# Patient Record
Sex: Female | Born: 1970 | Race: White | Hispanic: No | Marital: Single | Smoking: Never smoker
Health system: Southern US, Community
[De-identification: ages and names within clinical notes are randomized; demographics above are authoritative.]

## PROBLEM LIST (undated history)

## (undated) DIAGNOSIS — F909 Attention-deficit hyperactivity disorder, unspecified type: Secondary | ICD-10-CM

## (undated) HISTORY — PX: GASTRIC BYPASS: SHX52

---

## 2009-06-21 ENCOUNTER — Ambulatory Visit: Payer: Self-pay | Admitting: Internal Medicine

## 2009-09-12 ENCOUNTER — Ambulatory Visit: Payer: Self-pay | Admitting: Family Medicine

## 2010-07-08 ENCOUNTER — Ambulatory Visit: Payer: Self-pay | Admitting: Internal Medicine

## 2011-10-24 ENCOUNTER — Ambulatory Visit: Payer: Self-pay

## 2011-12-14 ENCOUNTER — Ambulatory Visit: Payer: Self-pay | Admitting: Family Medicine

## 2011-12-14 LAB — HCG, QUANTITATIVE, PREGNANCY: Beta Hcg, Quant.: <1 m[IU]/mL — ABNORMAL LOW

## 2013-03-04 ENCOUNTER — Ambulatory Visit: Payer: Self-pay

## 2013-05-17 IMAGING — CT CT ABD-PELV W/ CM
1 of 3 series · 13 of 32 positions shown, 18 images · non-contrast
Comparison: none

REASON FOR EXAM: CR [id number] RUQ Elevated White Blood Cell Count
Pelvic Pain
COMMENTS:

PROCEDURE:     CT  - CT ABDOMEN / PELVIS  W  - December 14, 2011  [DATE]
RESULT:     CT abdomen and pelvis dated 12/14/2011
TECHNIQUE: Helical 3 mm sections were obtained from lung bases through the
pubic symphysis status post intravenous ministration of 85 mL of Fsovue-GSS
and oral contrast.

[Series 2: 3mm soft tissue · axial · 0.68mm/px · z∈[-494,-86]mm · 13 of 156 slices shown, 18 images]
[im 10/156  soft-tissue]
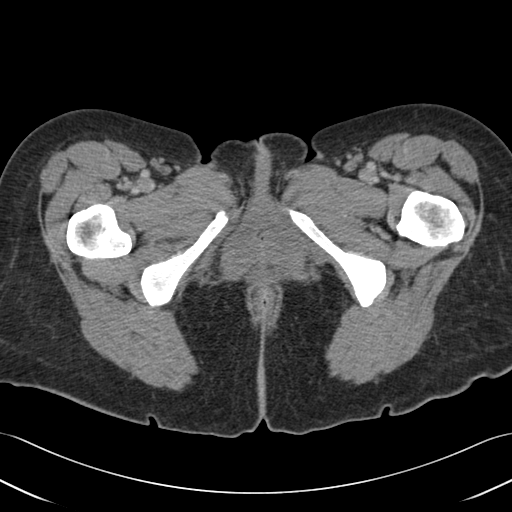
[im 10/156  bone]
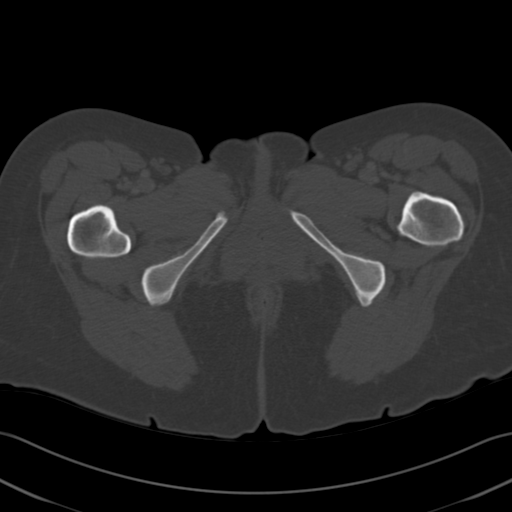
[im 20/156  soft-tissue]
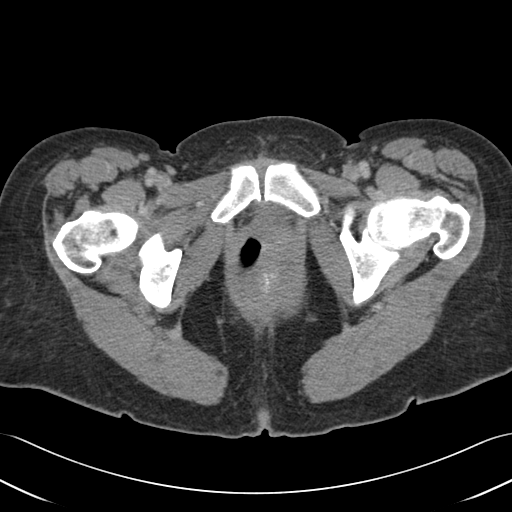
[im 39/156  soft-tissue]
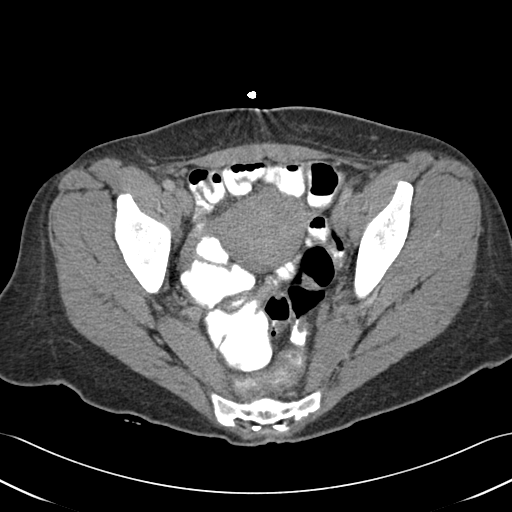
[im 49/156  soft-tissue]
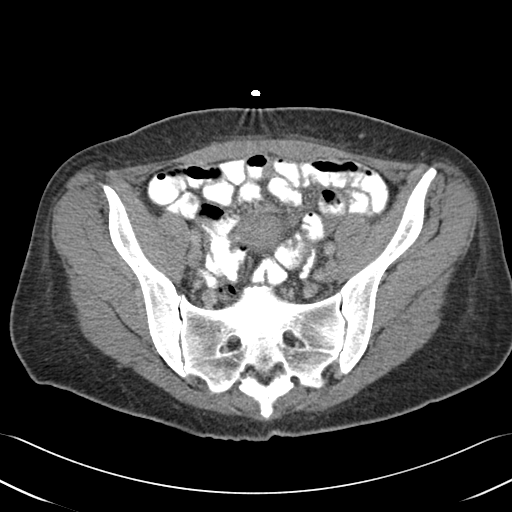
[im 59/156  soft-tissue]
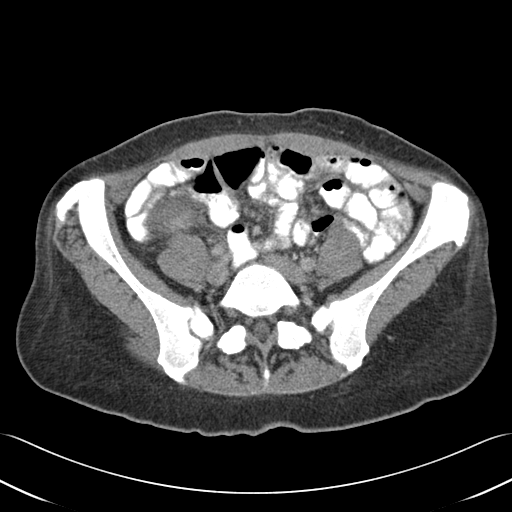
[im 68/156  soft-tissue]
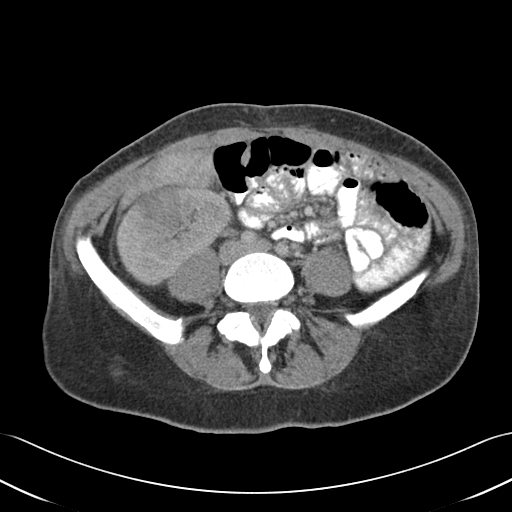
[im 88/156  soft-tissue]
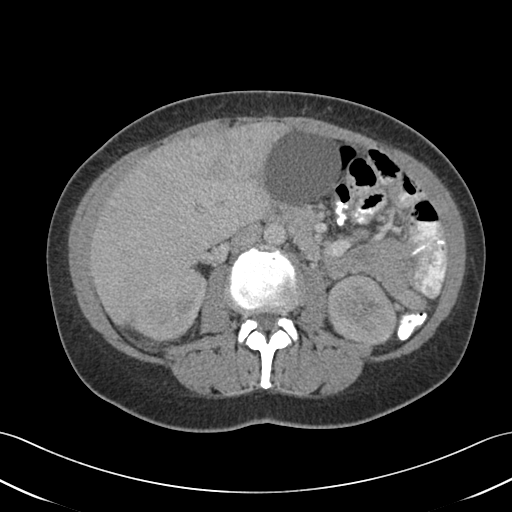
[im 97/156  soft-tissue]
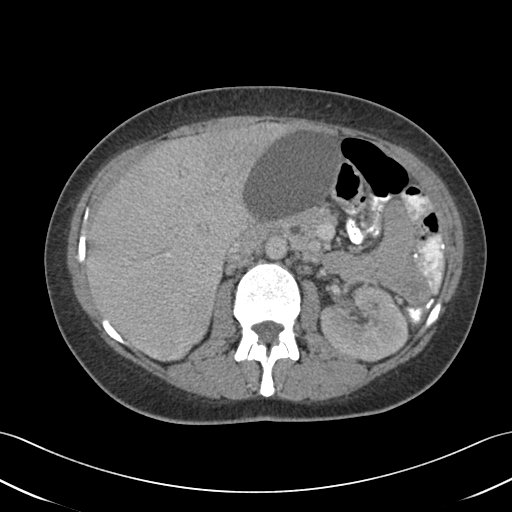
[im 107/156  soft-tissue]
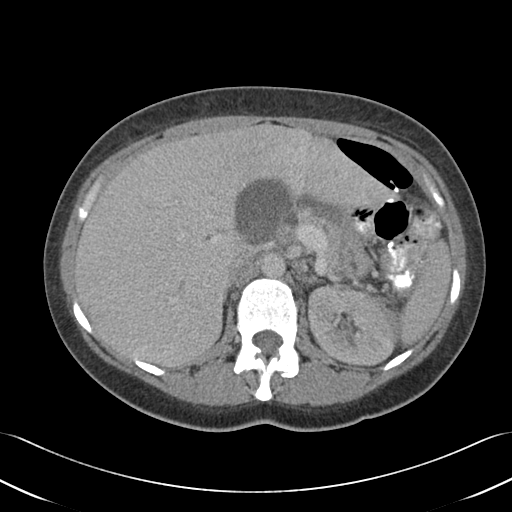
[im 107/156  bone]
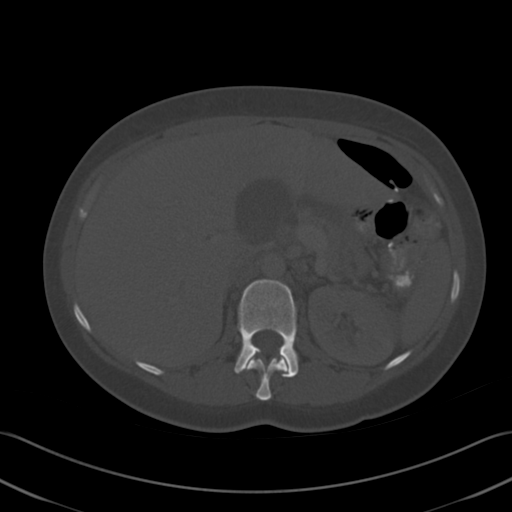
[im 117/156  soft-tissue]
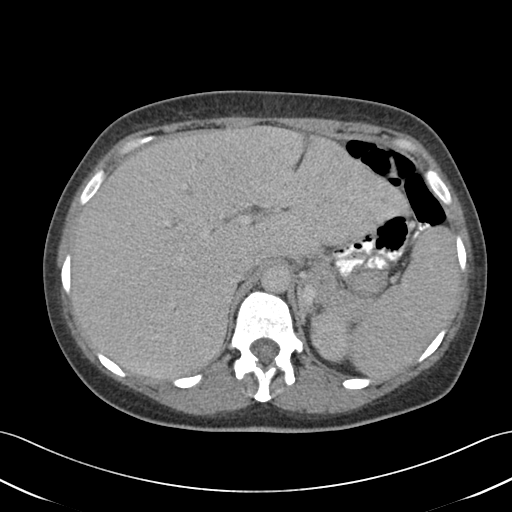
[im 117/156  lung]
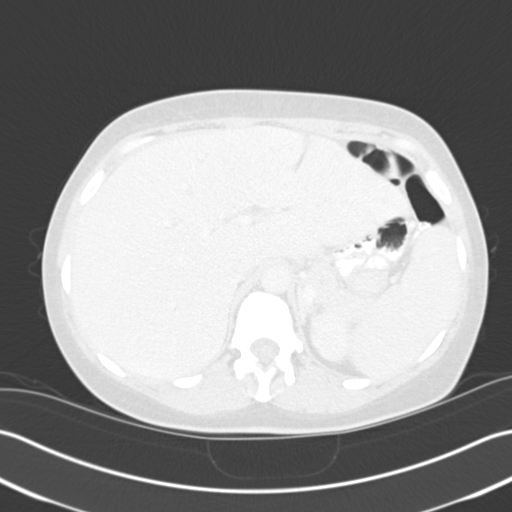
[im 126/156  lung]
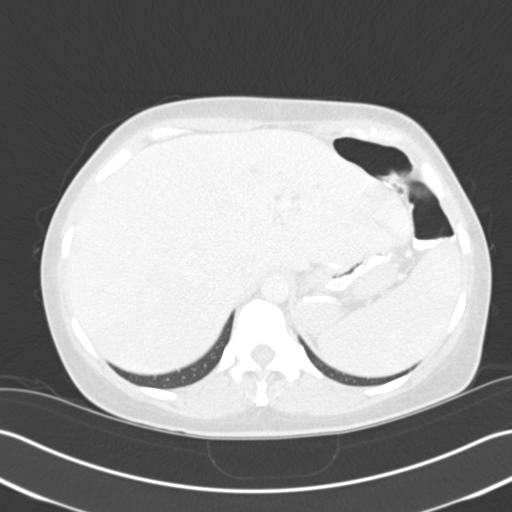
[im 136/156  soft-tissue]
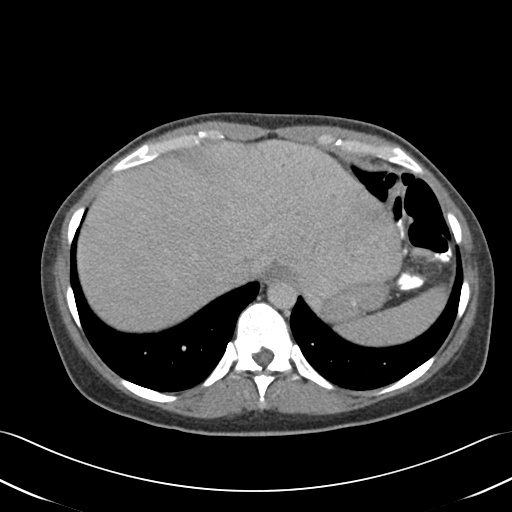
[im 136/156  lung]
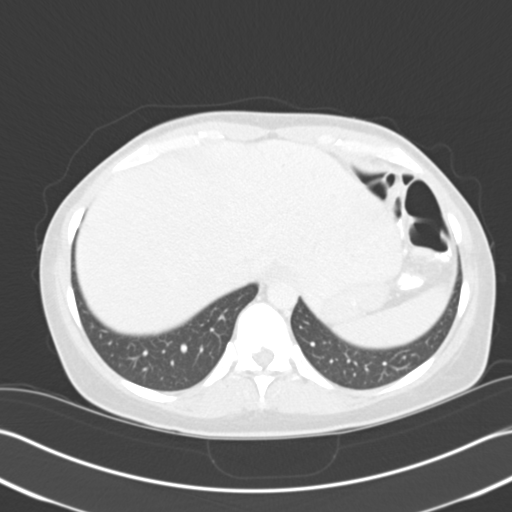
[im 146/156  soft-tissue]
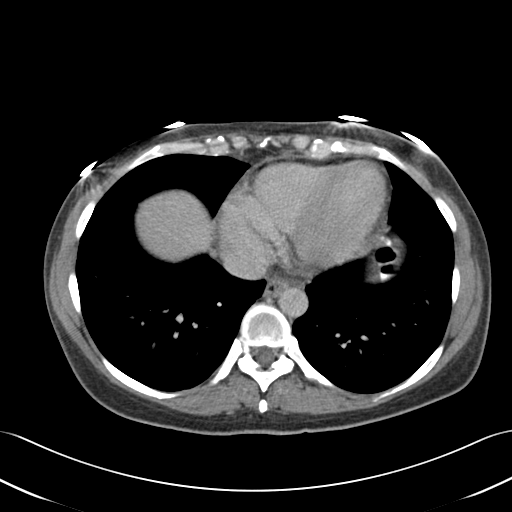
[im 146/156  lung]
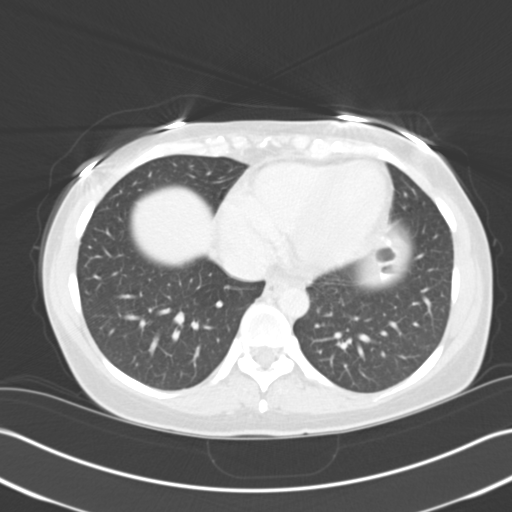

[13 of 32 positions shown; findings below may reference images not displayed]

FINDINGS: The lung bases are unremarkable.

The liver is enlarged and demonstrates a heterogeneous enhancement pattern
on the immediate contrasted images. Enhancement pattern is more homogeneous
on delayed imaging. The lower border of the right lobe of the liver
demonstrates a heterogeneous nodular appearance on the imaging. These
findings raise suspicion of cirrhotic changes within the liver particularly
if clinically appropriate. Clinical correlation is recommended. Mild
intrahepatic biliary ductal dilatation is identified. The gallbladder is
distended measuring 5.6 x 7.15 cm an AP versus transverse dimensions. There
is no evidence of pericholecystic fluid. The common bile that measures 9 mm
in diameter. Is no CT evidence of calcified gallstones.

Evaluation of the kidneys demonstrates bilateral heterogeneously enhancing
striated nephrograms particularly on delayed images. Within the anterior
lower border of the right kidney a more masslike heterogeneous region of
enhancement is identified. The striated nephrograms raise concern of
pyelonephritis and the finding within the anterior lower pole of the right
kidney may simply represent enhancement changes due to pyelo- nephritis.
More ominous etiologies cannot be excluded and surveillance evaluation is
recommended status post appropriate therapeutic regiment. Nonobstructing
calculi identified within the medullary portion of the right kidney.

The spleen, adrenals, pancreas are unremarkable. There is no evidence of
abdominal or pelvic free fluid, loculated fluid collections, masses. The
appendix appears to be air-filled and appears project within the posterior
aspect of the pelvis on the right. Otherwise no secondary signs reflecting
appendicitis. Prominent retroperitoneal lymph nodes are identified.

There is no CT evidence of bowel obstruction, enteritis, colitis nor
diverticulitis. There is no evidence of abdominal aortic aneurysm.
IMPRESSION: 1. Marked Hepatomegaly
2. Striated nephrograms likely reflecting bilateral pyelonephritis. Within
the area of the lower pole the right kidney the heterogeneously enhancing
mass like area may represent the sequela of phlegmonous change or simply
secondary to infectious and/or inflammatory changes within the kidneys. No
clinically if there are no signs of urinary tract infection an infiltrating
process involving the kidneys such as lymphoma and/or leukemia cannot be
excluded  considering the hepatomegaly. Three hydropic gallbladder.
4. Intra and extrahepatic biliary distention.
5. Prominent retroperitoneal lymph nodes. These may represent reactive lymph
nodes though more ominous etiologies again cannot be excluded.
6. These findings were discussed with Dr. Neusa Costa of the time of initial
interpretation on 12/14/2011 at [DATE] p.m. EST.

## 2014-05-20 ENCOUNTER — Ambulatory Visit: Payer: Self-pay | Admitting: Physician Assistant

## 2017-07-25 ENCOUNTER — Other Ambulatory Visit: Payer: Self-pay | Admitting: Medical

## 2017-07-25 DIAGNOSIS — Z1231 Encounter for screening mammogram for malignant neoplasm of breast: Secondary | ICD-10-CM

## 2017-08-06 ENCOUNTER — Encounter (INDEPENDENT_AMBULATORY_CARE_PROVIDER_SITE_OTHER): Payer: Self-pay

## 2017-08-06 ENCOUNTER — Other Ambulatory Visit: Payer: Self-pay | Admitting: Medical

## 2017-08-06 ENCOUNTER — Ambulatory Visit
Admission: RE | Admit: 2017-08-06 | Discharge: 2017-08-06 | Disposition: A | Discharge status: Home / Self Care | Payer: 59 | Source: Ambulatory Visit | Attending: Medical | Admitting: Medical

## 2017-08-06 DIAGNOSIS — Z1231 Encounter for screening mammogram for malignant neoplasm of breast: Secondary | ICD-10-CM

## 2018-03-30 ENCOUNTER — Encounter: Payer: Self-pay | Admitting: Gynecology

## 2018-03-30 ENCOUNTER — Other Ambulatory Visit: Payer: Self-pay

## 2018-03-30 ENCOUNTER — Ambulatory Visit (INDEPENDENT_AMBULATORY_CARE_PROVIDER_SITE_OTHER): Payer: 59

## 2018-03-30 ENCOUNTER — Ambulatory Visit
Admission: EM | Admit: 2018-03-30 | Discharge: 2018-03-30 | Disposition: A | Discharge status: Home / Self Care | Payer: 59

## 2018-03-30 DIAGNOSIS — S52501A Unspecified fracture of the lower end of right radius, initial encounter for closed fracture: Secondary | ICD-10-CM | POA: Diagnosis not present

## 2018-03-30 DIAGNOSIS — S52614A Nondisplaced fracture of right ulna styloid process, initial encounter for closed fracture: Secondary | ICD-10-CM | POA: Diagnosis not present

## 2018-03-30 DIAGNOSIS — S52611A Displaced fracture of right ulna styloid process, initial encounter for closed fracture: Secondary | ICD-10-CM

## 2018-03-30 DIAGNOSIS — W19XXXA Unspecified fall, initial encounter: Secondary | ICD-10-CM | POA: Diagnosis not present

## 2018-03-30 NOTE — ED Notes (Signed)
Sugar Tong splint applied right arm. PMS intact post application

## 2018-03-30 NOTE — ED Triage Notes (Signed)
Per patient fell x last night. Per patient was drinking at a bar x last pm. Pt. Stated was intoxicated when she fell. Pt. With right wrist pain and bruise to her right forehead.

## 2018-03-30 NOTE — ED Provider Notes (Addendum)
[facility]    CSN: [id number] Arrival date & time: 03/30/18  1152     History   Chief Complaint Chief Complaint  Patient presents with  . Appointment  . Wrist Pain    HPI Julie Dickson is a 47 y.o. female. Patient presents for right wrist pain and swelling. She states that she was intoxicated and passed out last night causing her to fall onto the right wrist. She says she does not really remember the accident. Admits to pain of 7/10 and pain with twisting of the wrist. States she also hit her had and has some abrasions to the right forehead. Admits to bruising of the right shoulder but pain is minimal and has full ROM of the right shoulder. Patient denies headaches, vision changes, problems with speech or gait, N/V or any other signs/symptoms of concussion.   HPI  No past medical history on file.  There are no active problems to display for this patient.   Past Surgical History:  Procedure Laterality Date  . CESAREAN SECTION    . GASTRIC BYPASS      OB History   None      Home Medications    Prior to Admission medications   Medication Sig Start Date End Date Taking? Authorizing Provider  ALPRAZolam Prudy Feeler) 0.5 MG tablet alprazolam 0.5 mg tablet   Yes [provider]  atenolol (TENORMIN) 25 MG tablet atenolol 25 mg tablet  TAKE 1 TABLET BY MOUTH DAILY 12/02/14  Yes [provider]  calcium carbonate 100 mg/ml SUSP Take by mouth.   Yes [provider]  desogestrel-ethinyl estradiol (VIORELE) 0.15-0.02/0.01 MG (21/5) tablet Viorele (28) 0.15 mg-0.02 mg (21)/0.01 mg (5) tablet  TAKE 1 TABLET BY MOUTH DAILY, SKIPPING PLACEBO TABLETS   Yes [provider]  ergocalciferol (VITAMIN D2) 50000 units capsule ergocalciferol (vitamin D2) 50,000 unit capsule   Yes [provider]  escitalopram (LEXAPRO) 20 MG tablet TK 1 T PO QD AC 03/08/18  Yes [provider]  Ibuprofen 200 MG CAPS Take by mouth.   Yes  [provider]  lisdexamfetamine (VYVANSE) 70 MG capsule Vyvanse 70 mg capsule   Yes [provider]  Multiple Vitamin (MULTIVITAMIN) capsule Take by mouth.   Yes [provider]  Multiple Vitamins-Minerals (MULTIVITAMIN GUMMIES ADULT PO) Adult Multivitamin Gummies  2 gummies by mouth twice-a-day   Yes [provider]  Multiple Vitamins-Minerals (MULTIVITAMIN GUMMIES ADULT PO) Adult Multivitamin Gummies  2 gummies by mouth twice-a-day   Yes [provider]  SUMAtriptan (IMITREX) 100 MG tablet sumatriptan 100 mg tablet 12/02/14  Yes [provider]    Family History Family History  Problem Relation Age of Onset  . Breast cancer Paternal Grandmother 36    Social History Social History   Tobacco Use  . Smoking status: Never Smoker  . Smokeless tobacco: Never Used  Substance Use Topics  . Alcohol use: Yes  . Drug use: Never     Allergies   Ceftin  [cefuroxime axetil] and Sulfamethoxazole-trimethoprim   Review of Systems Review of Systems  Constitutional: Negative for fatigue.  HENT: Negative for ear discharge, nosebleeds and rhinorrhea.   Eyes: Negative for photophobia, discharge and visual disturbance.  Respiratory: Negative for cough and shortness of breath.   Cardiovascular: Negative for chest pain and palpitations.  Gastrointestinal: Negative for abdominal pain, nausea and vomiting.  Musculoskeletal: Positive for arthralgias (right wrist) and joint swelling (right wrist). Negative for back pain, gait problem, myalgias, neck  pain and neck stiffness.  Skin: Positive for color change (contusions of wrist and shoulder) and wound (abrasions to right forehead).  Neurological: Negative for dizziness, seizures, speech difficulty, weakness, light-headedness, numbness and headaches.     Physical Exam Triage Vital Signs ED Triage Vitals  Enc Vitals Group     BP 03/30/18 1203 (!) 139/98     Pulse Rate 03/30/18 1203 78      Resp 03/30/18 1203 16     Temp 03/30/18 1203 98.4 F (36.9 C)     Temp Source 03/30/18 1203 Oral     SpO2 03/30/18 1203 100 %     Weight 03/30/18 1205 200 lb (90.7 kg)     Height 03/30/18 1205 5\' 4"  (1.626 m)     Head Circumference --      Peak Flow --      Pain Score 03/30/18 1204 4     Pain Loc --      Pain Edu? --      Excl. in GC? --    No data found.  Updated Vital Signs BP (!) 139/98 (BP Location: Left Arm)   Pulse 78   Temp 98.4 F (36.9 C) (Oral)   Resp 16   Ht 5\' 4"  (1.626 m)   Wt 200 lb (90.7 kg)   SpO2 100%   BMI 34.33 kg/m       Physical Exam  Constitutional: She is oriented to person, place, and time. She appears well-developed and well-nourished. No distress.  HENT:  Head: Normocephalic. Head is with abrasion and with contusion. Head is without raccoon's eyes, without laceration, without right periorbital erythema and without left periorbital erythema.  Right Ear: Tympanic membrane, external ear and ear canal normal.  Left Ear: Tympanic membrane, external ear and ear canal normal.  Nose: Nose normal.  Mouth/Throat: Oropharynx is clear and moist and mucous membranes are normal.  Eyes: Pupils are equal, round, and reactive to light. Conjunctivae and EOM are normal. Right eye exhibits no discharge. Left eye exhibits no discharge. No scleral icterus.  Neck: Normal range of motion. Neck supple.  Cardiovascular: Normal rate, regular rhythm and normal heart sounds.  No murmur heard. Pulmonary/Chest: Effort normal and breath sounds normal. No respiratory distress. She has no wheezes.  Musculoskeletal:       Right shoulder: She exhibits tenderness (over contusions, no bony tenderness). She exhibits normal range of motion, no bony tenderness, no swelling and no effusion.       Right wrist: She exhibits decreased range of motion (with flexion and extension, has most pain with supination), tenderness (distal ulna), bony tenderness (distal ulna) and swelling (distal  wrist). She exhibits no deformity.  Neurological: She is alert and oriented to person, place, and time. She displays normal reflexes. No cranial nerve deficit.  Skin: Skin is warm and dry. She is not diaphoretic.  Contusions of right distal wrist and right anterior and lateral shoulder. Abrasions of right forehead  Psychiatric: She has a normal mood and affect. Her behavior is normal.  Nursing note and vitals reviewed.    UC Treatments / Results  Labs (all labs ordered are listed, but only abnormal results are displayed) Labs Reviewed - No data to display  EKG None  Radiology Dg Wrist Complete Right  Result Date: 03/30/2018 CLINICAL DATA:  Pain after fall. EXAM: RIGHT WRIST - COMPLETE 3+ VIEW COMPARISON:  None. FINDINGS: Comminuted fracture through the radial metaphysis without significant displacement. Comminuted fracture through the ulnar styloid. IMPRESSION: Comminuted fractures  through the distal radial metaphysis in the ulnar styloid is above. Electronically Signed   By: Gerome Sam III M.D   On: 03/30/2018 13:59    Procedures Splint Application Date/Time: 03/30/2018 2:27 PM Performed by: Shirlee Latch, PA-C Authorized by: Shirlee Latch, PA-C   Consent:    Consent obtained:  Verbal   Consent given by:  Patient   Risks discussed:  Discoloration, numbness, pain and swelling   Alternatives discussed:  No treatment and delayed treatment Pre-procedure details:    Sensation:  Normal Procedure details:    Laterality:  Right   Location:  Wrist   Wrist:  R wrist   Splint type:  Sugar tong   Supplies:  Ortho-Glass Post-procedure details:    Pain:  Unchanged   Sensation:  Normal   Skin color:  Normal   Patient tolerance of procedure:  Tolerated well, no immediate complications   (including critical care time)  Medications Ordered in UC Medications - No data to display  Initial Impression / Assessment and Plan / UC Course  I have reviewed the triage vital signs and  the nursing notes.  Pertinent labs & imaging results that were available during my care of the patient were reviewed by me and considered in my medical decision making (see chart for details).    X-rays indicate fractured ulna and radius. Patient placed in sugar tong splint and advised to f/u with ortho. Education regarding fractures given to patient.   Final Clinical Impressions(s) / UC Diagnoses   Final diagnoses:  Fall, initial encounter  Closed nondisplaced fracture of styloid process of right ulna, initial encounter  Closed fracture of distal end of right radius, unspecified fracture morphology, initial encounter     Discharge Instructions     Your x-rays indicate that you fracture both bones in your wrist. You have been placed in a splint and will need to follow up with orthopedics in about a week. Do not use your right wrist until cleared by orthopedics. See handout on splint care. May take NSAIDs/tylenol for pain and inflammation.     ED Prescriptions    None     Controlled Substance Prescriptions [place] Controlled Substance Registry consulted? Not Applicable   Gareth Morgan 03/31/18 2350    Eusebio Friendly B, PA-C 03/31/18 2352

## 2018-03-30 NOTE — Discharge Instructions (Addendum)
Your x-rays indicate that you fracture both bones in your wrist. You have been placed in a splint and will need to follow up with orthopedics in about a week. Do not use your right wrist until cleared by orthopedics. See handout on splint care. May take NSAIDs/tylenol for pain and inflammation.

## 2019-06-09 ENCOUNTER — Other Ambulatory Visit: Payer: Self-pay

## 2019-06-09 DIAGNOSIS — Z20822 Contact with and (suspected) exposure to covid-19: Secondary | ICD-10-CM

## 2019-06-11 LAB — NOVEL CORONAVIRUS, NAA: SARS-CoV-2, NAA: NOT DETECTED

## 2021-04-27 ENCOUNTER — Encounter: Payer: Self-pay | Admitting: *Deleted

## 2021-04-28 ENCOUNTER — Encounter
Admission: RE | Disposition: A | Discharge status: Home / Self Care | Payer: Self-pay | Source: Home / Self Care | Attending: Gastroenterology

## 2021-04-28 ENCOUNTER — Ambulatory Visit: Payer: No Typology Code available for payment source | Admitting: Anesthesiology

## 2021-04-28 ENCOUNTER — Encounter: Payer: Self-pay | Admitting: *Deleted

## 2021-04-28 ENCOUNTER — Ambulatory Visit
Admission: RE | Admit: 2021-04-28 | Discharge: 2021-04-28 | Disposition: A | Discharge status: Home / Self Care | Payer: No Typology Code available for payment source | Attending: Gastroenterology | Admitting: Gastroenterology

## 2021-04-28 DIAGNOSIS — Z1211 Encounter for screening for malignant neoplasm of colon: Secondary | ICD-10-CM | POA: Diagnosis not present

## 2021-04-28 DIAGNOSIS — Z882 Allergy status to sulfonamides status: Secondary | ICD-10-CM | POA: Diagnosis not present

## 2021-04-28 DIAGNOSIS — Z980 Intestinal bypass and anastomosis status: Secondary | ICD-10-CM | POA: Diagnosis not present

## 2021-04-28 DIAGNOSIS — Z793 Long term (current) use of hormonal contraceptives: Secondary | ICD-10-CM | POA: Diagnosis not present

## 2021-04-28 DIAGNOSIS — K635 Polyp of colon: Secondary | ICD-10-CM | POA: Insufficient documentation

## 2021-04-28 DIAGNOSIS — Z79899 Other long term (current) drug therapy: Secondary | ICD-10-CM | POA: Diagnosis not present

## 2021-04-28 DIAGNOSIS — R1013 Epigastric pain: Secondary | ICD-10-CM | POA: Insufficient documentation

## 2021-04-28 DIAGNOSIS — Z9884 Bariatric surgery status: Secondary | ICD-10-CM | POA: Diagnosis not present

## 2021-04-28 DIAGNOSIS — Z881 Allergy status to other antibiotic agents status: Secondary | ICD-10-CM | POA: Insufficient documentation

## 2021-04-28 HISTORY — DX: Attention-deficit hyperactivity disorder, unspecified type: F90.9

## 2021-04-28 HISTORY — PX: COLONOSCOPY WITH PROPOFOL: SHX5780

## 2021-04-28 HISTORY — PX: ESOPHAGOGASTRODUODENOSCOPY: SHX5428

## 2021-04-28 LAB — POCT PREGNANCY, URINE: Preg Test, Ur: NEGATIVE

## 2021-04-28 SURGERY — EGD (ESOPHAGOGASTRODUODENOSCOPY)
Anesthesia: General

## 2021-04-28 MED ORDER — LIDOCAINE HCL (PF) 1 % IJ SOLN
INTRAMUSCULAR | Status: AC
  Filled 2021-04-28: qty 2

## 2021-04-28 MED ORDER — PROPOFOL 500 MG/50ML IV EMUL
INTRAVENOUS | Status: DC | PRN
  Administered 2021-04-28: 180 ug/kg/min via INTRAVENOUS

## 2021-04-28 MED ORDER — SODIUM CHLORIDE 0.9 % IV SOLN: INTRAVENOUS | Status: DC

## 2021-04-28 MED ORDER — LIDOCAINE HCL (PF) 2 % IJ SOLN
INTRAMUSCULAR | Status: DC | PRN
  Administered 2021-04-28: 100 mg via INTRADERMAL

## 2021-04-28 MED ORDER — PROPOFOL 500 MG/50ML IV EMUL
INTRAVENOUS | Status: AC
  Filled 2021-04-28: qty 50

## 2021-04-28 MED ORDER — PHENYLEPHRINE HCL (PRESSORS) 10 MG/ML IV SOLN
INTRAVENOUS | Status: AC
  Filled 2021-04-28: qty 1

## 2021-04-28 MED ORDER — PROPOFOL 10 MG/ML IV BOLUS
INTRAVENOUS | Status: DC | PRN
  Administered 2021-04-28: 100 mg via INTRAVENOUS

## 2021-04-28 NOTE — Anesthesia Preprocedure Evaluation (Addendum)
Anesthesia Evaluation  Patient identified by MRN, date of birth, ID band Patient awake    Reviewed: Allergy & Precautions, NPO status , Patient's Chart, lab work & pertinent test results  History of Anesthesia Complications Negative for: history of anesthetic complications  Airway Mallampati: I   Neck ROM: Full    Dental no notable dental hx.    Pulmonary neg pulmonary ROS,    Pulmonary exam normal breath sounds clear to auscultation       Cardiovascular Exercise Tolerance: Good negative cardio ROS Normal cardiovascular exam Rhythm:Regular Rate:Normal     Neuro/Psych  Headaches, PSYCHIATRIC DISORDERS (ADHD)    GI/Hepatic S/p gastric bypass   Endo/Other  negative endocrine ROS  Renal/GU negative Renal ROS     Musculoskeletal   Abdominal   Peds  Hematology negative hematology ROS (+)   Anesthesia Other Findings   Reproductive/Obstetrics                            Anesthesia Physical Anesthesia Plan  ASA: 2  Anesthesia Plan: General   Post-op Pain Management:    Induction: Intravenous  PONV Risk Score and Plan: 3 and Propofol infusion, TIVA and Treatment may vary due to age or medical condition  Airway Management Planned: Natural Airway  Additional Equipment:   Intra-op Plan:   Post-operative Plan:   Informed Consent: I have reviewed the patients History and Physical, chart, labs and discussed the procedure including the risks, benefits and alternatives for the proposed anesthesia with the patient or authorized representative who has indicated his/her understanding and acceptance.       Plan Discussed with: CRNA  Anesthesia Plan Comments:        Anesthesia Quick Evaluation

## 2021-04-28 NOTE — Anesthesia Postprocedure Evaluation (Signed)
Anesthesia Post Note  Patient: Julie Dickson. Gebhardt  Procedure(s) Performed: ESOPHAGOGASTRODUODENOSCOPY (EGD) COLONOSCOPY WITH PROPOFOL  Patient location during evaluation: PACU Anesthesia Type: General Level of consciousness: awake and alert, oriented and patient cooperative Pain management: pain level controlled Vital Signs Assessment: post-procedure vital signs reviewed and stable Respiratory status: spontaneous breathing, nonlabored ventilation and respiratory function stable Cardiovascular status: blood pressure returned to baseline and stable Postop Assessment: adequate PO intake Anesthetic complications: no   No notable events documented.   Last Vitals:  Vitals:   04/28/21 0833 04/28/21 0843  BP: (!) 156/98 (!) 159/92  Pulse:    Resp:    Temp:    SpO2:      Last Pain:  Vitals:   04/28/21 0823  TempSrc: Temporal  PainSc: Asleep                 Reed Breech

## 2021-04-28 NOTE — Transfer of Care (Signed)
Immediate Anesthesia Transfer of Care Note  Patient: Julie Dickson. Portier  Procedure(s) Performed: ESOPHAGOGASTRODUODENOSCOPY (EGD) COLONOSCOPY WITH PROPOFOL  Patient Location: PACU  Anesthesia Type:General  Level of Consciousness: sedated  Airway & Oxygen Therapy: Patient Spontanous Breathing and Patient connected to nasal cannula oxygen  Post-op Assessment: Report given to RN and Post -op Vital signs reviewed and stable  Post vital signs: Reviewed and stable  Last Vitals:  Vitals Value Taken Time  BP 127/86 04/28/21 0824  Temp 36 C 04/28/21 0823  Pulse 63 04/28/21 0826  Resp 15 04/28/21 0826  SpO2 100 % 04/28/21 0826  Vitals shown include unvalidated device data.  Last Pain:  Vitals:   04/28/21 0823  TempSrc: Temporal  PainSc: Asleep         Complications: No notable events documented.

## 2021-04-28 NOTE — Op Note (Signed)
[facility]enter[facility] Patient Name: Julie Dickson Procedure Date: 04/28/2021 7:37 AM MRN: [id number] Account #: [id number] Date of Birth: Nov 18, 1970 Admit Type: Outpatient Age: 50 Room: [facility] ENDO ROOM 3 Gender: Female Note Status: Finalized Instrument Name: Park Meo [id number] Procedure:             Colonoscopy Indications:           Screening for colorectal malignant neoplasm Providers:             Andrey Farmer MD, MD Referring MD:          Pearletha Alfred Medicines:             Monitored Anesthesia Care Complications:         No immediate complications. Estimated blood loss:                         Minimal. Procedure:             Pre-Anesthesia Assessment:                        - Prior to the procedure, a History and Physical was                         performed, and patient medications and allergies were                         reviewed. The patient is competent. The risks and                         benefits of the procedure and the sedation options and                         risks were discussed with the patient. All questions                         were answered and informed consent was obtained.                         Patient identification and proposed procedure were                         verified by the physician, the nurse, the anesthetist                         and the technician in the endoscopy suite. Mental                         Status Examination: alert and oriented. Airway                         Examination: normal oropharyngeal airway and neck                         mobility. Respiratory Examination: clear to                         auscultation. CV Examination: normal. Prophylactic  Antibiotics: The patient does not require prophylactic                         antibiotics. Prior Anticoagulants: The patient has                         taken no previous anticoagulant or antiplatelet                          agents. ASA Grade Assessment: II - A patient with mild                         systemic disease. After reviewing the risks and                         benefits, the patient was deemed in satisfactory                         condition to undergo the procedure. The anesthesia                         plan was to use monitored anesthesia care (MAC).                         Immediately prior to administration of medications,                         the patient was re-assessed for adequacy to receive                         sedatives. The heart rate, respiratory rate, oxygen                         saturations, blood pressure, adequacy of pulmonary                         ventilation, and response to care were monitored                         throughout the procedure. The physical status of the                         patient was re-assessed after the procedure.                        After obtaining informed consent, the colonoscope was                         passed under direct vision. Throughout the procedure,                         the patient's blood pressure, pulse, and oxygen                         saturations were monitored continuously. The                         Colonoscope was introduced through the anus and  advanced to the the terminal ileum. The colonoscopy                         was performed without difficulty. The patient                         tolerated the procedure well. The quality of the bowel                         preparation was good. Findings:      The perianal and digital rectal examinations were normal.      The terminal ileum appeared normal.      A 3 mm polyp was found in the descending colon. The polyp was sessile.       The polyp was removed with a cold snare. Resection and retrieval were       complete. Estimated blood loss was minimal.      The exam was otherwise without abnormality on direct and retroflexion        views. Impression:            - The examined portion of the ileum was normal.                        - One 3 mm polyp in the descending colon, removed with                         a cold snare. Resected and retrieved.                        - The examination was otherwise normal on direct and                         retroflexion views. Recommendation:        - Discharge patient to home.                        - Resume previous diet.                        - Continue present medications.                        - Await pathology results.                        - Repeat colonoscopy for surveillance based on                         pathology results.                        - Return to referring physician as previously                         scheduled. Procedure Code(s):     --- Professional ---                        [phone], Colonoscopy, flexible; with removal of  tumor(s), polyp(s), or other lesion(s) by snare                         technique Diagnosis Code(s):     --- Professional ---                        Z12.11, Encounter for screening for malignant neoplasm                         of colon                        K63.5, Polyp of colon CPT copyright 2019 [employer]. All rights reserved. The codes documented in this report are preliminary and upon coder review may  be revised to meet current compliance requirements. Andrey Farmer MD, MD 04/28/2021 8:26:03 AM Number of Addenda: 0 Note Initiated On: 04/28/2021 7:37 AM Scope Withdrawal Time: 0 hours 10 minutes 7 seconds  Total Procedure Duration: 0 hours 17 minutes 14 seconds  Estimated Blood Loss:  Estimated blood loss was minimal.      [facility]

## 2021-04-28 NOTE — Op Note (Signed)
[facility] Patient Name: Julie Dickson Procedure Date: 04/28/2021 7:38 AM MRN: [id number] Account #: [id number] Date of Birth: [birth year] Admit Type: Outpatient Age: 50 Room: [facility] ENDO ROOM 3 Gender: Female Note Status: Finalized Instrument Name: Upper Endoscope [id number] Procedure:             Upper GI endoscopy Indications:           Epigastric abdominal pain Providers:             Andrey Farmer MD, MD Referring MD:          Pearletha Alfred Medicines:             Monitored Anesthesia Care Complications:         No immediate complications. Procedure:             Pre-Anesthesia Assessment:                        - Prior to the procedure, a History and Physical was                         performed, and patient medications and allergies were                         reviewed. The patient is competent. The risks and                         benefits of the procedure and the sedation options and                         risks were discussed with the patient. All questions                         were answered and informed consent was obtained.                         Patient identification and proposed procedure were                         verified by the physician, the nurse, the anesthetist                         and the technician in the endoscopy suite. Mental                         Status Examination: alert and oriented. Airway                         Examination: normal oropharyngeal airway and neck                         mobility. Respiratory Examination: clear to                         auscultation. CV Examination: normal. Prophylactic                         Antibiotics: The patient does not require prophylactic  antibiotics. Prior Anticoagulants: The patient has                         taken no previous anticoagulant or antiplatelet                         agents. ASA Grade Assessment: II - A patient with mild                          systemic disease. After reviewing the risks and                         benefits, the patient was deemed in satisfactory                         condition to undergo the procedure. The anesthesia                         plan was to use monitored anesthesia care (MAC).                         Immediately prior to administration of medications,                         the patient was re-assessed for adequacy to receive                         sedatives. The heart rate, respiratory rate, oxygen                         saturations, blood pressure, adequacy of pulmonary                         ventilation, and response to care were monitored                         throughout the procedure. The physical status of the                         patient was re-assessed after the procedure.                        After obtaining informed consent, the endoscope was                         passed under direct vision. Throughout the procedure,                         the patient's blood pressure, pulse, and oxygen                         saturations were monitored continuously. The Endoscope                         was introduced through the mouth, and advanced to the                         jejunum. The upper GI endoscopy was accomplished  without difficulty. The patient tolerated the                         procedure well. Findings:      The examined esophagus was normal.      Evidence of a Roux-en-Y gastrojejunostomy was found. The gastrojejunal       anastomosis was characterized by healthy appearing mucosa. This was       traversed. The pouch-to-jejunum limb was characterized by healthy       appearing mucosa.      The examined jejunum was normal. Impression:            - Normal esophagus.                        - Roux-en-Y gastrojejunostomy with gastrojejunal                         anastomosis characterized by healthy appearing mucosa.                         - Normal examined jejunum.                        - No specimens collected. Recommendation:        - Discharge patient to home.                        - Resume previous diet.                        - Continue present medications.                        - Return to referring physician as previously                         scheduled. Procedure Code(s):     --- Professional ---                        [phone], Esophagogastroduodenoscopy, flexible,                         transoral; diagnostic, including collection of                         specimen(s) by brushing or washing, when performed                         (separate procedure) Diagnosis Code(s):     --- Professional ---                        Z98.0, Intestinal bypass and anastomosis status                        R10.13, Epigastric pain CPT copyright 2019 American Medical Association. All rights reserved. The codes documented in this report are preliminary and upon coder review may  be revised to meet current compliance requirements. Andrey Farmer MD, MD 04/28/2021 8:23:47 AM Number of Addenda: 0 Note Initiated On: 04/28/2021 7:38 AM Estimated Blood Loss:  Estimated blood loss: none.      [place]  [facility]

## 2021-04-28 NOTE — H&P (Signed)
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 Here for screening colonoscopy. No family history of GI malignancies although Father had colostomy for some reason. No blood thinners.    Current Facility-Administered Medications:    0.9 %  sodium chloride infusion, , Intravenous, Continuous, Shawniece Oyola, Rossie Muskrat, MD, Last Rate: 20 mL/hr at 04/28/21 0737, New Bag at 04/28/21 0737   lidocaine (PF) (XYLOCAINE) 1 % injection, , , ,   Medications Prior to Admission  Medication Sig Dispense Refill Last Dose   amphetamine-dextroamphetamine (ADDERALL) 20 MG tablet Take 20 mg by mouth daily.   04/27/2021   aspirin-acetaminophen-caffeine (EXCEDRIN MIGRAINE) 250-250-65 MG tablet Take by mouth every 6 (six) hours as needed for headache.      atenolol (TENORMIN) 25 MG tablet atenolol 25 mg tablet  TAKE 1 TABLET BY MOUTH DAILY   04/27/2021   desogestrel-ethinyl estradiol (MIRCETTE) 0.15-0.02/0.01 MG (21/5) tablet Viorele (28) 0.15 mg-0.02 mg (21)/0.01 mg (5) tablet  TAKE 1 TABLET BY MOUTH DAILY, SKIPPING PLACEBO TABLETS   04/27/2021   escitalopram (LEXAPRO) 20 MG tablet TK 1 T PO QD AC  1 04/27/2021   ferrous sulfate 325 (65 FE) MG EC tablet Take 325 mg by mouth 3 (three) times daily with meals.      lansoprazole (PREVACID) 30 MG capsule Take 30 mg by mouth daily at 12 noon.      Levonorgest-Eth Estrad 91-Day (SEASONALE PO) Take by mouth.      lisdexamfetamine (VYVANSE) 70 MG capsule Vyvanse 70 mg capsule   04/27/2021   tizanidine (ZANAFLEX) 2 MG capsule Take 2 mg by mouth 3 (three) times daily.      vitamin B-12 (CYANOCOBALAMIN) 100 MCG tablet Take 100 mcg by mouth daily.      ALPRAZolam (XANAX) 0.5 MG tablet alprazolam 0.5 mg tablet      calcium carbonate 100  mg/ml SUSP Take by mouth.      citalopram (CELEXA) 40 MG tablet Take 40 mg by mouth daily. (Patient not taking: Reported on 04/28/2021)   Not Taking   desvenlafaxine (PRISTIQ) 100 MG 24 hr tablet Take 100 mg by mouth daily. (Patient not taking: Reported on 04/28/2021)   Not Taking   ergocalciferol (VITAMIN D2) 50000 units capsule ergocalciferol (vitamin D2) 50,000 unit capsule      Ibuprofen 200 MG CAPS Take by mouth.      Multiple Vitamin (MULTIVITAMIN) capsule Take by mouth.      Multiple Vitamins-Minerals (MULTIVITAMIN GUMMIES ADULT PO) Adult Multivitamin Gummies  2 gummies by mouth twice-a-day      Multiple Vitamins-Minerals (MULTIVITAMIN GUMMIES ADULT PO) Adult Multivitamin Gummies  2 gummies by mouth twice-a-day      SUMAtriptan (IMITREX) 100 MG tablet sumatriptan 100 mg tablet        Allergies  Allergen Reactions   Ceftin  [Cefuroxime Axetil] Rash   Sulfamethoxazole-Trimethoprim Rash and Nausea And Vomiting     Past Medical History:  Diagnosis Date   ADHD (attention deficit hyperactivity disorder)     Review of systems:  Otherwise negative.    Physical Exam  Gen: Alert, oriented. Appears stated age.  HEENT: PERRLA. Lungs: No respiratory distress CV: RRR Abd: soft, benign, no masses Ext: No edema    Planned procedures: Proceed with EGD/colonoscopy. The patient understands the  nature of the planned procedure, indications, risks, alternatives and potential complications including but not limited to bleeding, infection, perforation, damage to internal organs and possible oversedation/side effects from anesthesia. The patient agrees and gives consent to proceed.  Please refer to procedure notes for findings, recommendations and patient disposition/instructions.     Regis Bill, MD [facility]

## 2021-04-28 NOTE — Interval H&P Note (Signed)
History and Physical Interval Note:  04/28/2021 7:47 AM  Julie Dickson  has presented today for surgery, with the diagnosis of CCA Screenh/ Epigastric pain.  The various methods of treatment have been discussed with the patient and family. After consideration of risks, benefits and other options for treatment, the patient has consented to  Procedure(s): ESOPHAGOGASTRODUODENOSCOPY (EGD) (N/A) COLONOSCOPY WITH PROPOFOL (N/A) as a surgical intervention.  The patient's history has been reviewed, patient examined, no change in status, stable for surgery.  I have reviewed the patient's chart and labs.  Questions were answered to the patient's satisfaction.     Regis Bill  Ok to proceed with EGD/Colonoscopy

## 2021-05-01 ENCOUNTER — Encounter: Payer: Self-pay | Admitting: Gastroenterology

## 2021-05-01 LAB — SURGICAL PATHOLOGY

## 2021-06-23 ENCOUNTER — Ambulatory Visit (INDEPENDENT_AMBULATORY_CARE_PROVIDER_SITE_OTHER): Payer: Self-pay | Admitting: Podiatry

## 2021-06-23 DIAGNOSIS — Z91199 Patient's noncompliance with other medical treatment and regimen due to unspecified reason: Secondary | ICD-10-CM

## 2021-06-26 NOTE — Progress Notes (Signed)
No show

## 2021-09-26 ENCOUNTER — Other Ambulatory Visit: Payer: Self-pay | Admitting: Medical

## 2021-09-26 DIAGNOSIS — Z1231 Encounter for screening mammogram for malignant neoplasm of breast: Secondary | ICD-10-CM

## 2021-10-11 ENCOUNTER — Other Ambulatory Visit: Payer: Self-pay

## 2021-10-11 ENCOUNTER — Ambulatory Visit
Admission: RE | Admit: 2021-10-11 | Discharge: 2021-10-11 | Disposition: A | Discharge status: Home / Self Care | Payer: No Typology Code available for payment source | Source: Ambulatory Visit | Attending: Medical | Admitting: Medical

## 2021-10-11 DIAGNOSIS — Z1231 Encounter for screening mammogram for malignant neoplasm of breast: Secondary | ICD-10-CM | POA: Insufficient documentation

## 2022-10-30 ENCOUNTER — Ambulatory Visit
Admission: EM | Admit: 2022-10-30 | Discharge: 2022-10-30 | Disposition: A | Discharge status: Home / Self Care | Payer: No Typology Code available for payment source

## 2022-10-30 DIAGNOSIS — J302 Other seasonal allergic rhinitis: Secondary | ICD-10-CM | POA: Diagnosis not present

## 2022-10-30 DIAGNOSIS — H6590 Unspecified nonsuppurative otitis media, unspecified ear: Secondary | ICD-10-CM

## 2022-10-30 NOTE — ED Provider Notes (Signed)
[facility]    CSN: [id number] Arrival date & time: 10/30/22  1707      History   Chief Complaint Chief Complaint  Patient presents with   Nasal Congestion    HPI Julie Dickson is a 52 y.o. female.   Patient is a 52 year old female who presents with chief complaint of upper respiratory symptoms that Julie Dickson states began last months.  Patient reporting runny nose, congestion, and sore throat secondary to her postnasal drip.  Patient reports her drainage did get better but about 2 weeks ago returned with more of a greenish color.  Julie Dickson reports a bad taste to her most recent mucus production.  Patient also reports some left ear "crackling".  Patient has been taking Allegra.  Julie Dickson also states Julie Dickson has been using a Nettie pot and saline rinse.  Julie Dickson reports Julie Dickson feels a bit better today.  Patient also reports using an occasional guaifenesin and Sudafed but limits his usage because it makes her feel like Julie Dickson is getting dried out.    Past Medical History:  Diagnosis Date   ADHD (attention deficit hyperactivity disorder)     There are no problems to display for this patient.   Past Surgical History:  Procedure Laterality Date   CESAREAN SECTION     COLONOSCOPY WITH PROPOFOL N/A 04/28/2021   Procedure: COLONOSCOPY WITH PROPOFOL;  Surgeon: Regis BillLocklear, Cameron T, MD;  Location: [facility] ENDOSCOPY;  Service: Endoscopy;  Laterality: N/A;   ESOPHAGOGASTRODUODENOSCOPY N/A 04/28/2021   Procedure: ESOPHAGOGASTRODUODENOSCOPY (EGD);  Surgeon: Regis BillLocklear, Cameron T, MD;  [facility]COPY;  Service: Endoscopy;  Laterality: N/A;   GASTRIC BYPASS      OB History   No obstetric history on file.      Home Medications    Prior to Admission medications   Medication Sig Start Date End Date Taking? Authorizing Provider  ALPRAZolam (XANAX) 0.5 MG tablet alprazolam 0.5 mg tablet    [provider]  amphetamine-dextroamphetamine (ADDERALL) 20 MG tablet Take 20 mg by mouth daily.     [provider]  aspirin-acetaminophen-caffeine (EXCEDRIN MIGRAINE) [phone] MG tablet Take by mouth every 6 (six) hours as needed for headache.    [provider]  atenolol (TENORMIN) 25 MG tablet atenolol 25 mg tablet  TAKE 1 TABLET BY MOUTH DAILY 12/02/14   [provider]  calcium carbonate 100 mg/ml SUSP Take by mouth.    [provider]  citalopram (CELEXA) 40 MG tablet Take 40 mg by mouth daily.    [provider]  desogestrel-ethinyl estradiol (MIRCETTE) 0.15-0.02/0.01 MG (21/5) tablet Viorele (28) 0.15 mg-0.02 mg (21)/0.01 mg (5) tablet  TAKE 1 TABLET BY MOUTH DAILY, SKIPPING PLACEBO TABLETS    [provider]  desvenlafaxine (PRISTIQ) 100 MG 24 hr tablet Take 100 mg by mouth daily. Patient not taking: Reported on 04/28/2021    [provider]  ergocalciferol (VITAMIN D2) 50000 units capsule ergocalciferol (vitamin D2) 50,000 unit capsule    [provider]  escitalopram (LEXAPRO) 20 MG tablet TK 1 T PO QD AC 03/08/18   [provider]  ferrous sulfate 325 (65 FE) MG EC tablet Take 325 mg by mouth 3 (three) times daily with meals.    [provider]  Ibuprofen 200 MG CAPS Take by mouth.    [provider]  lansoprazole (PREVACID) 30 MG capsule Take 30 mg by mouth daily at 12 noon.    [provider]  Terri PiedraLevonorgest-Eth Estrad 91-Day (SEASONALE PO) Take by mouth.  Patient not taking: Reported on 10/30/2022    [provider]  lisdexamfetamine (VYVANSE) 70 MG capsule Vyvanse 70 mg capsule    [provider]  Multiple Vitamin (MULTIVITAMIN) capsule Take by mouth.    [provider]  Multiple Vitamins-Minerals (MULTIVITAMIN GUMMIES ADULT PO) Adult Multivitamin Gummies  2 gummies by mouth twice-a-day    [provider]  Multiple Vitamins-Minerals (MULTIVITAMIN GUMMIES ADULT PO) Adult Multivitamin Gummies  2 gummies by mouth twice-a-day    [provider]  SUMAtriptan (IMITREX) 100 MG tablet sumatriptan 100 mg tablet 12/02/14   [provider]  tizanidine (ZANAFLEX) 2 MG capsule Take 2 mg by mouth 3 (three) times daily.    [provider]  vitamin B-12 (CYANOCOBALAMIN) 100 MCG tablet Take 100 mcg by mouth daily.    [provider]    Family History Family History  Problem Relation Age of Onset   Breast cancer Paternal Grandmother 49    Social History Social History   Tobacco Use   Smoking status: Never   Smokeless tobacco: Never  Substance Use Topics   Alcohol use: Yes   Drug use: Never     Allergies   Ceftin  [cefuroxime axetil] and Sulfamethoxazole-trimethoprim   Review of Systems Review of Systems as noted above in HPI.  Other systems reviewed and found be negative   Physical Exam Triage Vital Signs ED Triage Vitals [10/30/22 1718]  Enc Vitals Group     BP 135/88     Pulse Rate 75     Resp 18     Temp 97.6 F (36.4 C)     Temp Source Oral     SpO2 98 %     Weight      Height      Head Circumference      Peak Flow      Pain Score 0     Pain Loc      Pain Edu?      Excl. in GC?    No data found.  Updated Vital Signs BP 135/88 (BP Location: Left Arm)   Pulse 75   Temp 97.6 F (36.4 C) (Oral)   Resp 18   SpO2 98%   Visual Acuity Right Eye Distance:   Left Eye Distance:   Bilateral Distance:    Right Eye Near:   Left Eye Near:    Bilateral Near:     Physical Exam Constitutional:      Appearance: Normal appearance.  HENT:     Right Ear: Ear canal normal. A middle ear effusion is present. Tympanic membrane is not injected, erythematous or bulging.     Left Ear: Ear canal normal. A middle ear effusion is present. Tympanic membrane is not injected, erythematous or bulging.     Nose: Mucosal edema and congestion present.     Right Sinus: No maxillary sinus tenderness or frontal sinus tenderness.     Left Sinus: No maxillary sinus tenderness or frontal  sinus tenderness.     Mouth/Throat:     Mouth: Mucous membranes are moist.     Pharynx: Uvula midline.     Tonsils: 1+ on the right. 1+ on the left.     Comments: Minimal erythema to posterior oropharynx with clear postnasal drainage Cardiovascular:     Rate and Rhythm: Normal rate and regular rhythm.     Heart sounds: No murmur heard. Pulmonary:     Effort: Pulmonary effort is normal. No respiratory distress.  Breath sounds: Normal breath sounds. No wheezing or rhonchi.  Musculoskeletal:     Cervical back: Normal range of motion.  Lymphadenopathy:     Cervical: No cervical adenopathy.  Skin:    Capillary Refill: Capillary refill takes less than 2 seconds.  Neurological:     General: No focal deficit present.     Mental Status: Julie Dickson is alert and oriented to person, place, and time.      UC Treatments / Results  Labs (all labs ordered are listed, but only abnormal results are displayed) Labs Reviewed - No data to display  EKG   Radiology No results found.  Procedures Procedures (including critical care time)  Medications Ordered in UC Medications - No data to display  Initial Impression / Assessment and Plan / UC Course  I have reviewed the triage vital signs and the nursing notes.  Pertinent labs & imaging results that were available during my care of the patient were reviewed by me and considered in my medical decision making (see chart for details).    Patient reports ports ongoing upper respiratory symptoms times about a month.  Julie Dickson takes Careers adviser daily and uses a Nettie pot or saline rinse.  Intermittent use of guaifenesin and Sudafed but states these make her feel like Julie Dickson is drying out.  Reports her symptoms are little bit better today.  He states Julie Dickson took a home COVID test that was negative.  Bilateral ear effusions but no erythema or other sign of active ear infection.  Some clear postnasal drainage.  Will have patient continue her Allegra.  Will have her  add a Flonase in the morning and take her Allegra at night.  Will have her continue over-the-counter medications for symptom management. Final Clinical Impressions(s) / UC Diagnoses   Final diagnoses:  Seasonal allergies  Fluid level behind tympanic membrane, unspecified laterality     Discharge Instructions      -add Flonase nasal spray to each nostril in the morning and take your Allegra at night. -continue over-the-counter medication for symptom management. -Ocean Spray or regular nasal saline rinse can help keep the nasal passages moist while using the other medications for your overall symptoms. -There is some fluid in your ears but no overt signs of actual infection.  This is most likely fluid collection due to inflammation and drainage show your nasal/sinus passages -No overt signs of an active sinus infection. -Follow-up with primary care this department as needed should symptoms worsen or not improve     ED Prescriptions   None    PDMP not reviewed this encounter.   Candis Schatz, PA-C 10/30/22 1743

## 2022-10-30 NOTE — Discharge Instructions (Addendum)
-  add Flonase nasal spray to each nostril in the morning and take your Allegra at night. -continue over-the-counter medication for symptom management. -Ocean Spray or regular nasal saline rinse can help keep the nasal passages moist while using the other medications for your overall symptoms. -There is some fluid in your ears but no overt signs of actual infection.  This is most likely fluid collection due to inflammation and drainage show your nasal/sinus passages -No overt signs of an active sinus infection. -Follow-up with primary care this department as needed should symptoms worsen or not improve

## 2022-10-30 NOTE — ED Triage Notes (Signed)
Pt c/o nasal congestion, green nasal secretions, post nasal drip, and dry cough for over a month. States taking her allergy meds and other OTC meds with no relief.

## 2023-02-19 ENCOUNTER — Ambulatory Visit
Admission: EM | Admit: 2023-02-19 | Discharge: 2023-02-19 | Disposition: A | Discharge status: Home / Self Care | Payer: No Typology Code available for payment source

## 2023-02-19 DIAGNOSIS — J020 Streptococcal pharyngitis: Secondary | ICD-10-CM | POA: Diagnosis not present

## 2023-02-19 DIAGNOSIS — J029 Acute pharyngitis, unspecified: Secondary | ICD-10-CM | POA: Diagnosis present

## 2023-02-19 LAB — GROUP A STREP BY PCR: Group A Strep by PCR: DETECTED — AB

## 2023-02-19 MED ORDER — AMOXICILLIN 500 MG PO CAPS: 500.0000 mg | ORAL_CAPSULE | Freq: Two times a day (BID) | ORAL | 0 refills | Status: AC

## 2023-02-19 NOTE — ED Provider Notes (Signed)
[facility]    CSN: [id number] Arrival date & time: 02/19/23  4098      History   Chief Complaint Chief Complaint  Patient presents with   Sore Throat    HPI Julie Dickson. Ranieri is a 52 y.o. female presenting for low-grade fever of 100 degrees, fatigue, body aches, headaches, sore throat, diarrhea, congestion x 4 to 5 days.  Denies cough, chest pain, shortness of breath.  Reports a friend is also sick but he became sick after her.  She has been taking ibuprofen, Tylenol, Sudafed over-the-counter.  Patient reports relief of symptoms when taking medication but has not resolved throat becomes a lot more painful and headache worsens.  HPI  Past Medical History:  Diagnosis Date   ADHD (attention deficit hyperactivity disorder)     There are no problems to display for this patient.   Past Surgical History:  Procedure Laterality Date   CESAREAN SECTION     COLONOSCOPY WITH PROPOFOL N/A 04/28/2021   Procedure: COLONOSCOPY WITH PROPOFOL;  Surgeon: Regis Bill, MD;  Location: [facility] ENDOSCOPY;  Service: Endoscopy;  Laterality: N/A;   ESOPHAGOGASTRODUODENOSCOPY N/A 04/28/2021   Procedure: ESOPHAGOGASTRODUODENOSCOPY (EGD);  Surgeon: Regis Bill, MD;  Location: [facility] ENDOSCOPY;  Service: Endoscopy;  Laterality: N/A;   GASTRIC BYPASS      OB History   No obstetric history on file.      Home Medications    Prior to Admission medications   Medication Sig Start Date End Date Taking? Authorizing Provider  ALPRAZolam Prudy Feeler) 0.5 MG tablet alprazolam 0.5 mg tablet   Yes [provider]  amoxicillin (AMOXIL) 500 MG capsule Take 1 capsule (500 mg total) by mouth 2 (two) times daily for 10 days. 02/19/23 03/01/23 Yes Shirlee Latch, PA-C  amphetamine-dextroamphetamine (ADDERALL) 20 MG tablet Take 20 mg by mouth daily.   Yes [provider]  aspirin-acetaminophen-caffeine (EXCEDRIN MIGRAINE) [id number] MG tablet Take by mouth every 6 (six) hours as needed  for headache.   Yes [provider]  atenolol (TENORMIN) 25 MG tablet atenolol 25 mg tablet  TAKE 1 TABLET BY MOUTH DAILY 12/02/14  Yes [provider]  calcium carbonate 100 mg/ml SUSP Take by mouth.   Yes [provider]  citalopram (CELEXA) 40 MG tablet Take 40 mg by mouth daily.   Yes [provider]  desogestrel-ethinyl estradiol (MIRCETTE) 0.15-0.02/0.01 MG (21/5) tablet Viorele (28) 0.15 mg-0.02 mg (21)/0.01 mg (5) tablet  TAKE 1 TABLET BY MOUTH DAILY, SKIPPING PLACEBO TABLETS   Yes [provider]  ergocalciferol (VITAMIN D2) 50000 units capsule ergocalciferol (vitamin D2) 50,000 unit capsule   Yes [provider]  escitalopram (LEXAPRO) 20 MG tablet TK 1 T PO QD AC 03/08/18  Yes [provider]  Estradiol-Norethindrone Acet 0.5-0.1 MG tablet Take 1 tablet by mouth daily. 02/13/23  Yes [provider]  ferrous sulfate 325 (65 FE) MG EC tablet Take 325 mg by mouth 3 (three) times daily with meals.   Yes [provider]  Ibuprofen 200 MG CAPS Take by mouth.   Yes [provider]  lansoprazole (PREVACID) 30 MG capsule Take 30 mg by mouth daily at 12 noon.   Yes [provider]  lisdexamfetamine (VYVANSE) 70 MG capsule Vyvanse 70 mg capsule   Yes [provider]  Multiple Vitamin (MULTIVITAMIN) capsule Take by mouth.   Yes [provider]  Multiple Vitamins-Minerals (MULTIVITAMIN GUMMIES ADULT PO) Adult Multivitamin Gummies  2 gummies by mouth twice-a-day  Yes [provider]  Multiple Vitamins-Minerals (MULTIVITAMIN GUMMIES ADULT PO) Adult Multivitamin Gummies  2 gummies by mouth twice-a-day   Yes [provider]  SUMAtriptan (IMITREX) 100 MG tablet sumatriptan 100 mg tablet 12/02/14  Yes [provider]  tizanidine (ZANAFLEX) 2 MG capsule Take 2 mg by mouth 3 (three) times daily.   Yes [provider]  vitamin B-12 (CYANOCOBALAMIN) 100 MCG  tablet Take 100 mcg by mouth daily.   Yes [provider]  desvenlafaxine (PRISTIQ) 100 MG 24 hr tablet Take 100 mg by mouth daily. Patient not taking: Reported on 04/28/2021    [provider]  Levonorgest-Eth Estrad 91-Day (SEASONALE PO) Take by mouth. Patient not taking: Reported on 10/30/2022    [provider]    Family History Family History  Problem Relation Age of Onset   Breast cancer Paternal Grandmother 52    Social History Social History   Tobacco Use   Smoking status: Never   Smokeless tobacco: Never  Substance Use Topics   Alcohol use: Yes   Drug use: Never     Allergies   Armodafinil, Ceftin  [cefuroxime axetil], and Sulfamethoxazole-trimethoprim   Review of Systems Review of Systems  Constitutional:  Positive for fatigue and fever. Negative for chills and diaphoresis.  HENT:  Positive for congestion, ear pain (bilateral ear pressure) and sore throat. Negative for rhinorrhea, sinus pressure and sinus pain.   Respiratory:  Negative for cough and shortness of breath.   Cardiovascular:  Negative for chest pain.  Gastrointestinal:  Positive for diarrhea. Negative for abdominal pain, nausea and vomiting.  Musculoskeletal:  Positive for myalgias.  Skin:  Negative for rash.  Neurological:  Positive for headaches. Negative for weakness.  Hematological:  Negative for adenopathy.     Physical Exam Triage Vital Signs ED Triage Vitals [02/19/23 0853]  Encounter Vitals Group     BP      Systolic BP Percentile      Diastolic BP Percentile      Pulse      Resp 16     Temp      Temp Source Oral     SpO2      Weight      Height      Head Circumference      Peak Flow      Pain Score      Pain Loc      Pain Education      Exclude from Growth Chart    No data found.  Updated Vital Signs BP 127/87 (BP Location: Right Arm)   Pulse 87   Temp 98.5 F (36.9 C) (Oral)   Resp 16   Ht 5' 3.5" (1.613 m)   Wt 200 lb (90.7 kg)   SpO2  98%   BMI 34.87 kg/m      Physical Exam Vitals and nursing note reviewed.  Constitutional:      General: She is not in acute distress.    Appearance: Normal appearance. She is not ill-appearing or toxic-appearing.  HENT:     Head: Normocephalic and atraumatic.     Right Ear: Tympanic membrane, ear canal and external ear normal.     Left Ear: Tympanic membrane, ear canal and external ear normal.     Nose: Nose normal.     Mouth/Throat:     Mouth: Mucous membranes are moist.     Pharynx: Oropharynx is clear. Posterior oropharyngeal erythema present.  Eyes:     General: No  scleral icterus.       Right eye: No discharge.        Left eye: No discharge.     Conjunctiva/sclera: Conjunctivae normal.  Cardiovascular:     Rate and Rhythm: Normal rate and regular rhythm.     Heart sounds: Normal heart sounds.  Pulmonary:     Effort: Pulmonary effort is normal. No respiratory distress.     Breath sounds: Normal breath sounds.  Musculoskeletal:     Cervical back: Neck supple.  Skin:    General: Skin is dry.  Neurological:     General: No focal deficit present.     Mental Status: She is alert. Mental status is at baseline.     Motor: No weakness.     Gait: Gait normal.  Psychiatric:        Mood and Affect: Mood normal.        Behavior: Behavior normal.        Thought Content: Thought content normal.      UC Treatments / Results  Labs (all labs ordered are listed, but only abnormal results are displayed) Labs Reviewed  GROUP A STREP BY PCR - Abnormal; Notable for the following components:      Result Value   Group A Strep by PCR DETECTED (*)    All other components within normal limits    EKG   Radiology No results found.  Procedures Procedures (including critical care time)  Medications Ordered in UC Medications - No data to display  Initial Impression / Assessment and Plan / UC Course  I have reviewed the triage vital signs and the nursing notes.  Pertinent  labs & imaging results that were available during my care of the patient were reviewed by me and considered in my medical decision making (see chart for details).   52 year old female presents for sore throat, fatigue, body aches, diarrhea, congestion, headaches x 4 to 5 days.  Also reports low-grade temperature of 100 degrees.  Vitals are normal and stable.  She appears well overall.  On exam she has erythema posterior pharynx, otherwise normal exam.  PCR strep test performed.  Positive.  Discussed result patient.  Will treat strep infection with amoxicillin.  Also advised for care.  Return precautions.   Final Clinical Impressions(s) / UC Diagnoses   Final diagnoses:  Strep pharyngitis   Discharge Instructions   None    ED Prescriptions     Medication Sig Dispense Auth. Provider   amoxicillin (AMOXIL) 500 MG capsule Take 1 capsule (500 mg total) by mouth 2 (two) times daily for 10 days. 20 capsule Shirlee Latch, PA-C      PDMP not reviewed this encounter.   Shirlee Latch, PA-C 02/19/23 [phone]

## 2023-02-19 NOTE — ED Triage Notes (Signed)
Pt c/o sore throat & bodyaches x4 days. Denies any fevers. Has tried advil,sudafed & tylenol w/o relief.

## 2023-03-20 ENCOUNTER — Ambulatory Visit (INDEPENDENT_AMBULATORY_CARE_PROVIDER_SITE_OTHER): Payer: No Typology Code available for payment source

## 2023-03-20 ENCOUNTER — Ambulatory Visit: Payer: No Typology Code available for payment source | Admitting: Podiatry

## 2023-03-20 ENCOUNTER — Encounter: Payer: Self-pay | Admitting: Podiatry

## 2023-03-20 DIAGNOSIS — M19072 Primary osteoarthritis, left ankle and foot: Secondary | ICD-10-CM | POA: Diagnosis not present

## 2023-03-20 DIAGNOSIS — M2022 Hallux rigidus, left foot: Secondary | ICD-10-CM | POA: Diagnosis not present

## 2023-03-20 DIAGNOSIS — B351 Tinea unguium: Secondary | ICD-10-CM | POA: Diagnosis not present

## 2023-03-20 MED ORDER — EFINACONAZOLE 10 % EX SOLN: 1.0000 [drp] | Freq: Every day | CUTANEOUS | 11 refills | Status: AC

## 2023-03-20 MED ORDER — TERBINAFINE HCL 250 MG PO TABS: 250.0000 mg | ORAL_TABLET | Freq: Every day | ORAL | 0 refills | Status: AC

## 2023-03-20 NOTE — Progress Notes (Signed)
  Subjective:  Patient ID: Julie Dickson, female    DOB: 1971/06/24,  MRN: [id number]  Chief Complaint  Patient presents with   Nail Problem    "I've lost almost all of this toenail." N - toenail L - hallux right D - 2 years O - suddenly, gradually worse C - started off as a black spot, mushy, brittle, fallen off A - none T - bandaid   Foot Pain    "My left joint hurts in my left big toe." N - joint pain L - hallux left D - 2 years O - suddenly, about the same C - discomfort A - walking, certain shoes T - wear birkenstocks most of the time    52 y.o. female presents with the above complaint. History confirmed with patient.   Objective:  Physical Exam: warm, good capillary refill, no trophic changes or ulcerative lesions, normal DP and PT pulses, normal sensory exam, and onychomycosis with discoloration onycholysis of the right hallux nail, limited range of motion of the left MTPJ with painful range of motion.     Radiographs: Multiple views x-ray of the left foot: Even symmetric joint space narrowing of the first MTPJ Assessment:   1. Hallux rigidus of left foot   2. Onychomycosis      Plan:  Patient was evaluated and treated and all questions answered.  We reviewed her radiographs and discussed the narrowing of the MTPJ and discussed she has hallux rigidus and narrowing of the joint space consistent with osteoarthritis.  Currently only intermittently painful.  We discussed using Voltaren gel (dealing with kidney infection and AKI recently so would like to avoid NSAIDs right now) supportive comfortable stiff shoes and corticosteroid injection if it worsens.  Long-term we discussed the likelihood of necessity of surgical intervention if it continues to worsen which would likely consist of a first MPJ arthrodesis.  She will let me know if this worsens for further treatment.  Onychomycosis -Educated on etiology of nail fungus. -Discussed oral topical and laser therapy  and risks and benefits of each -eRx for oral terbinafine #90. Educated on risks and benefits of the medication.  E Rx also for Jublia topical sent to specialty pharmacy.  She will wait on starting the Lamisil until she is cleared by her PCP with her recent AKI. -Photographs taken   Return in about 4 months (around 07/20/2023) for follow up after nail fungus treatment.

## 2023-03-21 ENCOUNTER — Other Ambulatory Visit: Payer: Self-pay | Admitting: Medical

## 2023-03-21 DIAGNOSIS — R1011 Right upper quadrant pain: Secondary | ICD-10-CM

## 2023-03-22 ENCOUNTER — Other Ambulatory Visit: Payer: Self-pay | Admitting: Medical

## 2023-03-22 DIAGNOSIS — Z1231 Encounter for screening mammogram for malignant neoplasm of breast: Secondary | ICD-10-CM

## 2023-03-28 ENCOUNTER — Ambulatory Visit
Admission: RE | Admit: 2023-03-28 | Discharge: 2023-03-28 | Disposition: A | Discharge status: Home / Self Care | Payer: No Typology Code available for payment source | Source: Ambulatory Visit | Attending: Medical | Admitting: Medical

## 2023-03-28 DIAGNOSIS — R1011 Right upper quadrant pain: Secondary | ICD-10-CM | POA: Diagnosis present

## 2023-04-11 ENCOUNTER — Ambulatory Visit
Admission: RE | Admit: 2023-04-11 | Discharge: 2023-04-11 | Disposition: A | Discharge status: Home / Self Care | Payer: No Typology Code available for payment source | Source: Ambulatory Visit | Attending: Medical | Admitting: Medical

## 2023-04-11 DIAGNOSIS — Z1231 Encounter for screening mammogram for malignant neoplasm of breast: Secondary | ICD-10-CM | POA: Diagnosis present

## 2023-07-01 ENCOUNTER — Encounter: Payer: Self-pay | Admitting: Podiatry

## 2023-07-01 ENCOUNTER — Ambulatory Visit (INDEPENDENT_AMBULATORY_CARE_PROVIDER_SITE_OTHER): Payer: No Typology Code available for payment source | Admitting: Podiatry

## 2023-07-01 DIAGNOSIS — M19072 Primary osteoarthritis, left ankle and foot: Secondary | ICD-10-CM | POA: Diagnosis not present

## 2023-07-01 DIAGNOSIS — B351 Tinea unguium: Secondary | ICD-10-CM

## 2023-07-02 NOTE — Progress Notes (Signed)
  Subjective:  Patient ID: Julie Dickson. Cregg, female    DOB: 02-Jan-1971,  MRN: [id number]  Chief Complaint  Patient presents with   Nail Problem    "They're the same.  I still have the Kidney issue so I haven't been able to take the medication yet.  I haven't used the topical either."   Toe Pain    "I want to get an injection in my toe.  The right one is having the same problem but not as bad."    52 y.o. female presents with the above complaint. History confirmed with patient.   Objective:  Physical Exam: warm, good capillary refill, no trophic changes or ulcerative lesions, normal DP and PT pulses, normal sensory exam, and onychomycosis with discoloration onycholysis of the right hallux nail which appears to be improved so far, limited range of motion of the left MTPJ with painful range of motion.     Radiographs: Multiple views x-ray of the left foot: Even symmetric joint space narrowing of the first MTPJ Assessment:   1. 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  Much improvement in the great toe joint today and she elected for corticosteroid injection.  Following prep with Betadine and consent the left first metatarsophalangeal joint was injected with 20 mg Kenalog and 0.5 cc of 0 provide percent Marcaine plain.  It was dressed with a bandage.  If not improving would recommend we plan for surgical correction.  Onychomycosis Improving with its own growth.  She will try to use the Jublia as much as possible.  Agree that she should hold off on using the terbinafine until her kidney issues have improved.   No follow-ups on file.

## 2023-07-10 ENCOUNTER — Ambulatory Visit: Payer: No Typology Code available for payment source | Admitting: Podiatry

## 2023-07-15 ENCOUNTER — Ambulatory Visit: Payer: No Typology Code available for payment source | Admitting: Podiatry

## 2023-10-30 ENCOUNTER — Ambulatory Visit (INDEPENDENT_AMBULATORY_CARE_PROVIDER_SITE_OTHER): Payer: No Typology Code available for payment source | Admitting: Podiatry

## 2023-10-30 DIAGNOSIS — Z91199 Patient's noncompliance with other medical treatment and regimen due to unspecified reason: Secondary | ICD-10-CM

## 2023-10-31 NOTE — Progress Notes (Signed)
 Patient was no-show for appointment today

## 2024-03-14 ENCOUNTER — Encounter: Payer: Self-pay | Admitting: Emergency Medicine

## 2024-03-14 ENCOUNTER — Ambulatory Visit
Admission: EM | Admit: 2024-03-14 | Discharge: 2024-03-14 | Disposition: A | Discharge status: Home / Self Care | Attending: Family Medicine | Admitting: Family Medicine

## 2024-03-14 DIAGNOSIS — N3001 Acute cystitis with hematuria: Secondary | ICD-10-CM | POA: Insufficient documentation

## 2024-03-14 LAB — URINALYSIS, W/ REFLEX TO CULTURE (INFECTION SUSPECTED)
Bilirubin Urine: NEGATIVE
Glucose, UA: NEGATIVE mg/dL
Ketones, ur: NEGATIVE mg/dL
Nitrite: NEGATIVE
Protein, ur: 30 mg/dL — AB
Specific Gravity, Urine: 1.02 (ref 1.005–1.030)
WBC, UA: >50 WBC/hpf (ref 0–5)
pH: 5.5 (ref 5.0–8.0)

## 2024-03-14 MED ORDER — NITROFURANTOIN MONOHYD MACRO 100 MG PO CAPS: 100.0000 mg | ORAL_CAPSULE | Freq: Two times a day (BID) | ORAL | 0 refills | Status: AC

## 2024-03-14 NOTE — ED Triage Notes (Signed)
 Patient reports some dysuria that started a week ago.  Patient reports urinary urgency and frequency that started yesterday.  Patient denies fevers.

## 2024-03-14 NOTE — Discharge Instructions (Signed)
 You have a urinary tract infection. I sent your urine for culture to be sure the antibiotic prescribed will treat your infection. Someone may call you to change antibiotics. Stop by the pharmacy to pick up your prescriptions.  Follow up with your primary care provider or return to the urgent care, if not improving.

## 2024-03-14 NOTE — ED Provider Notes (Signed)
 [facility]    CSN: [id number] Arrival date & time: 03/14/24  0935      History   Chief Complaint Chief Complaint  Patient presents with   Dysuria     HPI HPI Julie Dickson is a 53 y.o. female.    Julie Dickson presents for dysuria for 1 week with urinary frequency and urgency that started yesterday. Has weird sensation in her vulvar area.  She took a home UTI test and it was positive for leukocytes. Notes history of frequet UTI. She doenst really have dysuria when they occur.  Last year, she was hospitalized for UTI  for 5 days.  Tried Cystex prior to arrival which helped.  Has  not had any antibiotics in last 30 days.   Denies known STI exposure.t.  No LMP recorded. Patient is postmenopausal.    - Abnormal vaginal discharge: no - vaginal odor: no - vaginal bleeding: no - Dysuria: no  - Hematuria: no - Urinary urgency: yes  - Urinary frequency: yes  - Fever: no - Abdominal pain: no  - Pelvic pain: no - Rash/Skin lesions/mouth ulcers: no - Nausea: no  - Vomiting: no  - Back Pain: no        Past Medical History:  Diagnosis Date   ADHD (attention deficit hyperactivity disorder)     There are no active problems to display for this patient.   Past Surgical History:  Procedure Laterality Date   CESAREAN SECTION     COLONOSCOPY WITH PROPOFOL  N/A 04/28/2021   Procedure: COLONOSCOPY WITH PROPOFOL ;  Surgeon: Maryruth Ole DASEN, MD;  Location: [facility] ENDOSCOPY;  Service: Endoscopy;  Laterality: N/A;   ESOPHAGOGASTRODUODENOSCOPY N/A 04/28/2021   Procedure: ESOPHAGOGASTRODUODENOSCOPY (EGD);  Surgeon: Maryruth Ole DASEN, MD;  Location: [facility] ENDOSCOPY;  Service: Endoscopy;  Laterality: N/A;   GASTRIC BYPASS      OB History   No obstetric history on file.      Home Medications    Prior to Admission medications   Medication Sig Start Date End Date Taking? Authorizing Provider  nitrofurantoin , macrocrystal-monohydrate, (MACROBID ) 100 MG capsule Take  1 capsule (100 mg total) by mouth 2 (two) times daily. 03/14/24  Yes Emet Rafanan, DO  PREMARIN vaginal cream Insert 0.5 applicatorsful twice a week by vaginal route for 90 days. 08/22/23  Yes [provider]  AIMOVIG 140 MG/ML SOAJ INJECT 1 ML SUBCUTANEOUSLY EVERY MONTH    [provider]  ALPRAZolam (XANAX) 0.5 MG tablet alprazolam 0.5 mg tablet    [provider]  amphetamine-dextroamphetamine (ADDERALL) 20 MG tablet Take 20 mg by mouth daily.    [provider]  aspirin-acetaminophen-caffeine (EXCEDRIN MIGRAINE) 250-250-65 MG tablet Take by mouth every 6 (six) hours as needed for headache. Patient not taking: Reported on 03/20/2023    [provider]  atenolol (TENORMIN) 25 MG tablet atenolol 25 mg tablet  TAKE 1 TABLET BY MOUTH DAILY 12/02/14   [provider]  Efinaconazole  10 % SOLN Apply 1 drop topically daily. 03/20/23   McDonald, Juliene SAUNDERS, DPM  ergocalciferol (VITAMIN D2) 50000 units capsule ergocalciferol (vitamin D2) 50,000 unit capsule    [provider]  Estradiol-Norethindrone Acet 0.5-0.1 MG tablet Take 1 tablet by mouth daily. 02/13/23   [provider]  Lactobacillus (CULTURELLE ADVANCED REGULARITY) CAPS Take 30 mg by mouth.    [provider]  lisdexamfetamine (VYVANSE) 70 MG capsule Vyvanse 70 mg capsule    [provider]  Multiple Vitamin (MULTIVITAMIN) capsule Take by  mouth.    [provider]  Multiple Vitamins-Minerals (MULTIVITAMIN GUMMIES ADULT PO) Adult Multivitamin Gummies  2 gummies by mouth twice-a-day    [provider]  Multiple Vitamins-Minerals (MULTIVITAMIN GUMMIES ADULT PO) Adult Multivitamin Gummies  2 gummies by mouth twice-a-day    [provider]  olmesartan (BENICAR) 20 MG tablet Take 20 mg by mouth daily.    [provider]  SUMAtriptan (IMITREX) 100 MG tablet sumatriptan 100 mg tablet 12/02/14   [provider]  UBRELVY 50 MG  TABS TAKE ONE TABLET AT MIGRAINE ONSET    [provider]  Vilazodone HCl (VIIBRYD) 10 MG TABS TAKE 1 TABLET BY MOUTH EVERY DAY BEFORE A MEAL    [provider]  vitamin B-12 (CYANOCOBALAMIN) 100 MCG tablet Take 100 mcg by mouth daily.    [provider]    Family History Family History  Problem Relation Age of Onset   Breast cancer Paternal Grandmother 37    Social History Social History   Tobacco Use   Smoking status: Never   Smokeless tobacco: Never  Vaping Use   Vaping status: Never Used  Substance Use Topics   Alcohol use: Yes    Comment: weekends - social   Drug use: Never     Allergies   Armodafinil, Ceftin  [cefuroxime axetil], and Sulfamethoxazole-trimethoprim   Review of Systems Review of Systems: :negative unless otherwise stated in HPI.      Physical Exam Triage Vital Signs ED Triage Vitals  Encounter Vitals Group     BP 03/14/24 0949 117/84     Girls Systolic BP Percentile --      Girls Diastolic BP Percentile --      Boys Systolic BP Percentile --      Boys Diastolic BP Percentile --      Pulse Rate 03/14/24 0949 92     Resp 03/14/24 0949 14     Temp 03/14/24 0949 98.3 F (36.8 C)     Temp Source 03/14/24 0949 Oral     SpO2 03/14/24 0949 96 %     Weight 03/14/24 0941 195 lb (88.5 kg)     Height 03/14/24 0941 5' 3 (1.6 m)     Head Circumference --      Peak Flow --      Pain Score 03/14/24 0941 3     Pain Loc --      Pain Education --      Exclude from Growth Chart --    No data found.  Updated Vital Signs BP 117/84 (BP Location: Right Arm)   Pulse 92   Temp 98.3 F (36.8 C) (Oral)   Resp 14   Ht 5' 3 (1.6 m)   Wt 88.5 kg   SpO2 96%   BMI 34.54 kg/m   Visual Acuity Right Eye Distance:   Left Eye Distance:   Bilateral Distance:    Right Eye Near:   Left Eye Near:    Bilateral Near:     Physical Exam GEN: well appearing female in no acute distress  CVS: well perfused  RESP: speaking in full  sentences without pause     UC Treatments / Results  Labs (all labs ordered are listed, but only abnormal results are displayed) Labs Reviewed  URINALYSIS, W/ REFLEX TO CULTURE (INFECTION SUSPECTED) - Abnormal; Notable for the following components:      Result Value   APPearance CLOUDY (*)    Hgb urine dipstick SMALL (*)  Protein, ur 30 (*)    Leukocytes,Ua LARGE (*)    Bacteria, UA FEW (*)    All other components within normal limits  URINE CULTURE    EKG   Radiology No results found.  Procedures Procedures (including critical care time)  Medications Ordered in UC Medications - No data to display  Initial Impression / Assessment and Plan / UC Course  I have reviewed the triage vital signs and the nursing notes.  Pertinent labs & imaging results that were available during my care of the patient were reviewed by me and considered in my medical decision making (see chart for details).       Acute cystitis:  Patient is a 53 y.o. female  who presents for 7 days of  dysuria and  urinary frequency with urgency.  Overall patient is well-appearing and afebrile.  Vital signs stable.  UA consistent with acute cystitis.  Hematuria supported on microscopy.  Treat with Macrobid  2 times daily for 5 days.  Urine culture obtained.  Follow-up sensitivities and change antibiotics, if needed.   - Return precautions including abdominal pain, fever, chills, nausea, or vomiting given. Follow-up,  if symptoms not improving or getting worse. Discussed MDM, treatment plan and plan for follow-up with patientwho agrees with plan.        Final Clinical Impressions(s) / UC Diagnoses   Final diagnoses:  Acute cystitis with hematuria     Discharge Instructions      You have a urinary tract infection. I sent your urine for culture to be sure the antibiotic prescribed will treat your infection. Someone may call you to change antibiotics. Stop by the pharmacy to pick up your  prescriptions.  Follow up with your primary care provider or return to the urgent care, if not improving.       ED Prescriptions     Medication Sig Dispense Auth. Provider   nitrofurantoin , macrocrystal-monohydrate, (MACROBID ) 100 MG capsule Take 1 capsule (100 mg total) by mouth 2 (two) times daily. 10 capsule Daekwon Beswick, DO      PDMP not reviewed this encounter.   Lavender Stanke, DO 03/14/24 1019

## 2024-03-15 LAB — URINE CULTURE

## 2024-03-20 ENCOUNTER — Ambulatory Visit (HOSPITAL_COMMUNITY): Payer: Self-pay
